# Patient Record
Sex: Female | Born: 1975 | Hispanic: Yes | Marital: Single | Smoking: Never smoker
Health system: Southern US, Community
[De-identification: ages and names within clinical notes are randomized; demographics above are authoritative.]

---

## 2018-03-31 ENCOUNTER — Other Ambulatory Visit: Payer: Self-pay | Admitting: Obstetrics and Gynecology

## 2018-03-31 DIAGNOSIS — Z1231 Encounter for screening mammogram for malignant neoplasm of breast: Secondary | ICD-10-CM

## 2018-06-10 ENCOUNTER — Ambulatory Visit (HOSPITAL_COMMUNITY): Payer: Self-pay

## 2018-07-16 ENCOUNTER — Other Ambulatory Visit (HOSPITAL_COMMUNITY): Payer: Self-pay | Admitting: *Deleted

## 2018-07-16 DIAGNOSIS — Z01419 Encounter for gynecological examination (general) (routine) without abnormal findings: Secondary | ICD-10-CM

## 2018-07-18 ENCOUNTER — Inpatient Hospital Stay: Payer: Self-pay

## 2018-07-18 ENCOUNTER — Inpatient Hospital Stay: Payer: Self-pay | Attending: Obstetrics and Gynecology

## 2018-07-18 DIAGNOSIS — Z01419 Encounter for gynecological examination (general) (routine) without abnormal findings: Secondary | ICD-10-CM

## 2018-07-18 LAB — HEMOGLOBIN A1C
HEMOGLOBIN A1C: 5.7 % — AB (ref 4.8–5.6)
Mean Plasma Glucose: 116.89 mg/dL

## 2018-07-18 LAB — LIPID PANEL
Cholesterol: 182 mg/dL (ref 0–200)
HDL: 63 mg/dL (ref >40)
LDL CALC: 106 mg/dL — AB (ref 0–99)
TRIGLYCERIDES: 63 mg/dL (ref <150)
Total CHOL/HDL Ratio: 2.9 RATIO
VLDL: 13 mg/dL (ref 0–40)

## 2018-07-18 NOTE — Addendum Note (Signed)
Addended by: Deforest Hoyles D on: 07/18/2018 08:56 AM   Modules accepted: Orders

## 2018-07-18 NOTE — Progress Notes (Unsigned)
[facility] initial screening  Spanish interpreter- Ana Alverez-Pevida  Clinical Measurement:  Height: 60in Weight:  123lb Blood Pressure:  100/70 Blood Pressure #2: 100/58  Fasting Labs Drawn Today, will review with patient when they result.  Medical History:  Patient states that she has not been diagnosed with high cholesterol, high blood pressure, diabetes or heart disease.  Medications:  Patients states she is not taking any medications for high cholesterol, high blood pressure or diabetes.  She is not taking aspirin daily to prevent heart attack or stroke.    Blood pressure, self measurement:  Patients states she does not measure blood pressure at home.    Nutrition:  Patient states she eats 2 cups of fruit and 1 cup of vegetables in an average day.  Patient states she does not eat fish regularly, she eats more than half a serving of whole grains daily. She drinks less than 36 ounces of beverages with added sugar weekly.  She is not  watching her sodium intake.  She has had 1 drink containing alcohol in the last seven days.  Physical activity:  Patient states that she gets 120 minutes of moderate exercise in a week.  She gets 90 minutes of vigorous exercise per week.      Smoking status:  Patient states she has never smoked and is not around any smokers.    Quality of life:  Patient states that she has had 0 bad physical days out of the last 30 days. In the last 2 weeks, she has had 0 days that she has felt down or depressed. She has had 0 days in the last 2 weeks that she has had little interest or pleasure in doing things.  Risk reduction and counseling:  Patient states she wants to  increase fruit and vegetable intake.  I encouraged her to continue with current exercise regimen and increase vegetable and fruit intake.  Navigation:  I will notify patient of lab results.  Patient is aware of 2 more health coaching sessions and a follow up.

## 2018-07-21 ENCOUNTER — Telehealth (HOSPITAL_COMMUNITY): Payer: Self-pay | Admitting: *Deleted

## 2018-07-21 NOTE — Telephone Encounter (Signed)
Health coaching 2  Spanish interpreter- Amber Long  Labs- LDL cholesterol 106, cholesterol 182, triglycerides 63, HDL cholesterol 63, hemoglobin A!C 5.7, mean plasma glucose 116.89  Patient is aware and understands these lab results.  Goals- Patient states that she is plans to increase her exercise.  I encouraged patient to increase her exercise regimen to 150 minutes weekly.  Patient states that she will increase her consumption of fruits and vegetables.  I encouraged patient to eat at least 5 fruits and vegetables daily.  Navigation:  Patient is aware of 1 more health coaching sessions and a follow up.  Time-10 minutes

## 2018-08-22 ENCOUNTER — Telehealth: Payer: Self-pay | Admitting: *Deleted

## 2018-08-22 NOTE — Telephone Encounter (Signed)
Via interpeter, Erika McReynolds, telephoned patient left a message regarding [facility]  health coaching.  

## 2018-09-16 ENCOUNTER — Other Ambulatory Visit: Payer: Self-pay

## 2018-09-16 ENCOUNTER — Ambulatory Visit
Admission: RE | Admit: 2018-09-16 | Discharge: 2018-09-16 | Disposition: A | Discharge status: Home / Self Care | Payer: Self-pay | Source: Ambulatory Visit | Attending: Obstetrics and Gynecology | Admitting: Obstetrics and Gynecology

## 2018-09-16 ENCOUNTER — Encounter (HOSPITAL_COMMUNITY): Payer: Self-pay

## 2018-09-16 ENCOUNTER — Ambulatory Visit (HOSPITAL_COMMUNITY)
Admission: RE | Admit: 2018-09-16 | Discharge: 2018-09-16 | Disposition: A | Discharge status: Home / Self Care | Payer: Self-pay | Source: Ambulatory Visit | Attending: Obstetrics and Gynecology | Admitting: Obstetrics and Gynecology

## 2018-09-16 VITALS — BP 102/64 | Wt 125.0 lb

## 2018-09-16 DIAGNOSIS — Z1239 Encounter for other screening for malignant neoplasm of breast: Secondary | ICD-10-CM

## 2018-09-16 DIAGNOSIS — Z1231 Encounter for screening mammogram for malignant neoplasm of breast: Secondary | ICD-10-CM

## 2018-09-16 NOTE — Patient Instructions (Signed)
Explained breast self awareness with Trishelle Laux. Patient did not need a Pap smear today due to last Pap smear was in August 2019 per patient. Let her know [facility] will cover Pap smears every 3 years unless has a history of abnormal Pap smears. Referred patient to the [facility] for a screening mammogram. Appointment scheduled for Tuesday, September 16, 2018 at 1410. Patient aware of appointment and will be there. Let patient know the Breast Center will follow up with her within the next couple weeks with results of mammogram by letter or phone. Kyndra Gramajo verbalized understanding.  Matis Monnier, Kathaleen Maser, RN 1:22 PM

## 2018-09-16 NOTE — Progress Notes (Signed)
No complaints today.   Pap Smear: Pap smear not completed today. Last Pap smear was in August 2019 at [facility] and normal per patient. Per patient has a history of an abnormal Pap smear 20 years ago that a colposcopy was completed for follow-up. Patient stated all Pap smears have been normal since colposcopy and has had more than three Pap smears. No Pap smear results are in Epic.  Physical exam: Breasts Breasts symmetrical. No skin abnormalities bilateral breasts. No nipple retraction bilateral breasts. No nipple discharge bilateral breasts. No lymphadenopathy. No lumps palpated bilateral breasts. No complaints of pain or tenderness on exam. Referred patient to the [facility] for a screening mammogram. Appointment scheduled for Tuesday, September 16, 2018 at 1410.        Pelvic/Bimanual No Pap smear completed today since last Pap smear was in August 2019 per patient. Pap smear not indicated per [facility] guidelines.   Smoking History: Patient has never smoked.  Patient Navigation: Patient education provided. Access to services provided for patient through [facility] program. Spanish interpreter provided.   Breast and Cervical Cancer Risk Assessment: Patient has no family history of breast cancer, known genetic mutations, or radiation treatment to the chest before age 52. Per patient has a history of cervical dysplasia. Patient has no history of being immunocompromised or DES exposure in-utero.  Risk Assessment    Risk Scores      09/16/2018   Last edited by: Lynnell Dike, LPN   5-year risk: 0.4 %   Lifetime risk: 5 %         Used Spanish interpreter Natale Lay from [employer].

## 2018-10-08 ENCOUNTER — Encounter (HOSPITAL_COMMUNITY): Payer: Self-pay | Admitting: *Deleted

## 2018-11-25 ENCOUNTER — Telehealth: Payer: Self-pay

## 2018-11-25 NOTE — Telephone Encounter (Signed)
Health Coaching 3  Spanish interpreter- Natale Lay   Goals- Patient states that she has been working out for at least 1 hour a day Monday-Saturday. Patient states that she has been doing online cardio workout videos Monday- Friday and that she has been running outside for 1 hour on Saturdays. Patient states that she has been consuming 2 cups of fruits daily and maybe 1 serving of vegetables daily. I encouraged patient to keep up the great work with her exercise regimen and with her fruit intake.   New goal-  Patient states that she wants her new goal to be to increase the amount of vegetables she eatings daily. I encouraged patient to try and aim for 2 1/2 cups of vegetables daily.  Barrier to reaching goal- Patient states that she doesn't have a lot of time every day to cut/chop and prepare vegetables to eat.     Strategies to overcome- I encouraged patient to look for fast and convenient options at the grocery store. I told her to look for already chopped vegetables in the produce section. I discussed with her how she could look for spaghetti sauces that had vegetables already added in them and she could look for frozen vegetable steamer bags that she could quickly microwave. I told her to also look for juices like V8 that contain vegetables and are also low in sugar as well as already prepared smoothies with vegetables included in the refrigerated produce section.   Navigation:  Patient is scheduled for final in person follow-up visit on July 8th @ 2:00 pm.   Time-

## 2019-01-06 ENCOUNTER — Telehealth: Payer: Self-pay

## 2019-01-06 NOTE — Telephone Encounter (Signed)
Called patient to do COVID screening for [facility] appointment. Voicemail box was full.

## 2019-01-07 ENCOUNTER — Ambulatory Visit: Payer: PRIVATE HEALTH INSURANCE

## 2020-05-07 IMAGING — MG DIGITAL SCREENING BILATERAL MAMMOGRAM WITH TOMO AND CAD
8 series · 9 of 24 positions shown · non-contrast
Comparison: None.

CLINICAL DATA: Screening.

EXAM:
DIGITAL SCREENING BILATERAL MAMMOGRAM WITH TOMO AND CAD

[R MLO synth-2D]
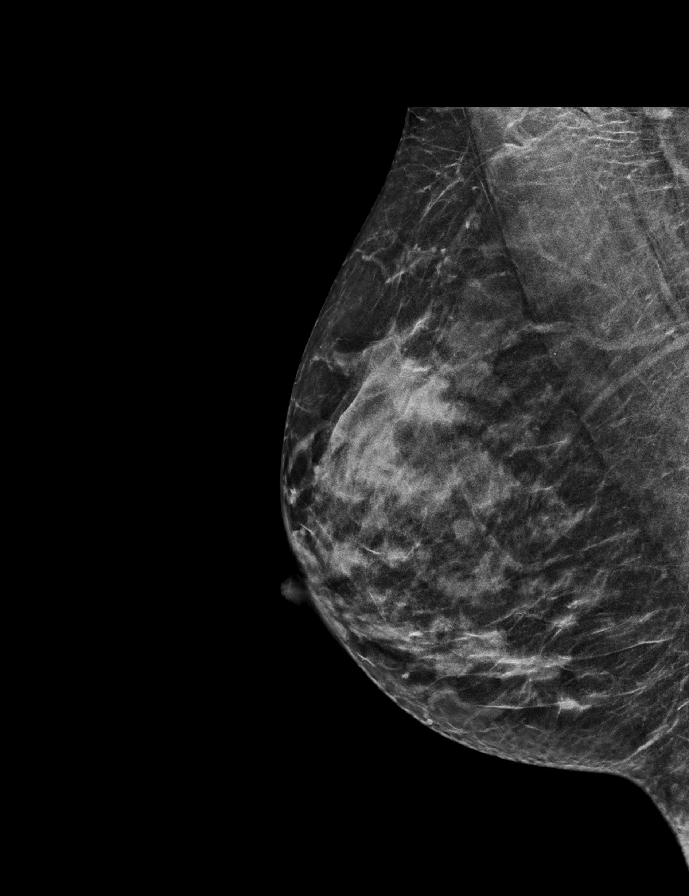

[L CC synth-2D]
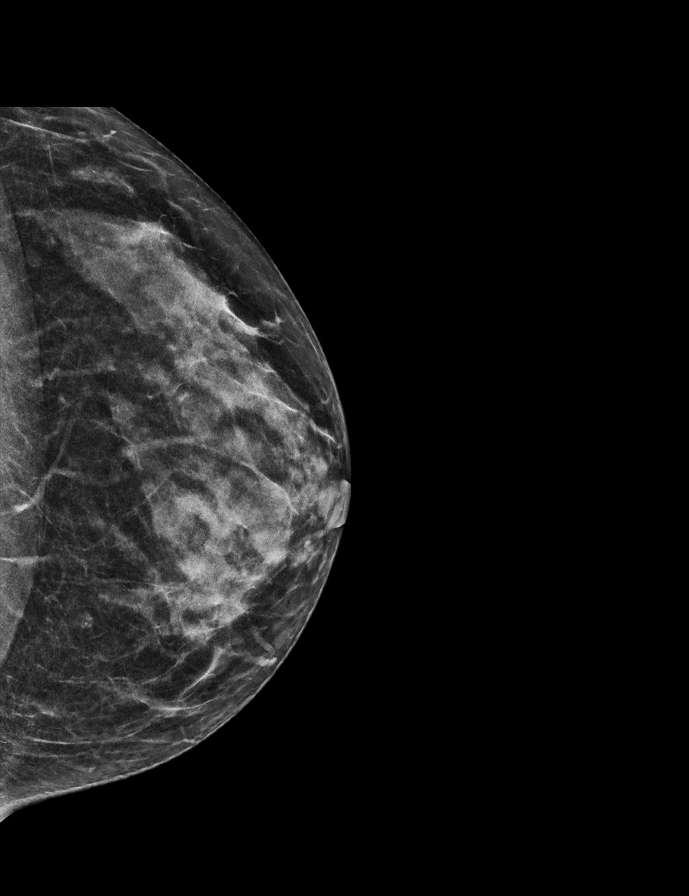

[R CC synth-2D]
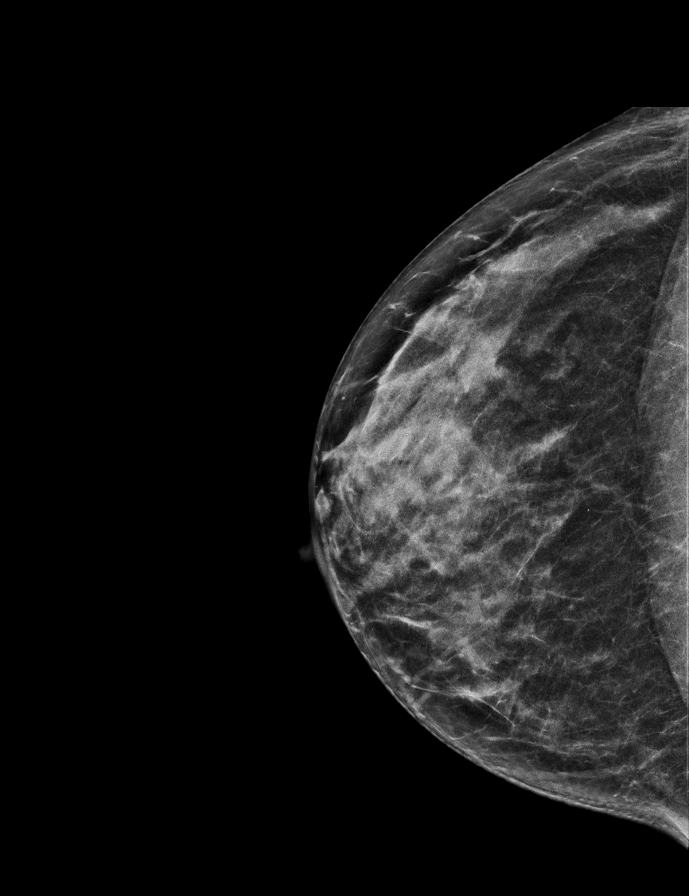

[L MLO synth-2D]
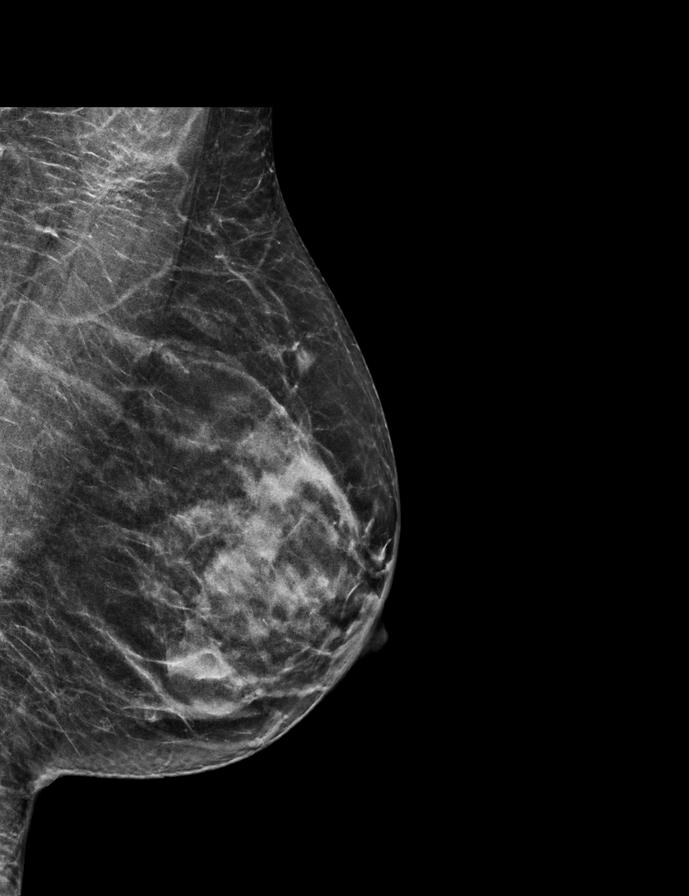

[R MLO tomo · 2 of 51 frames shown]
[frame 17/51]
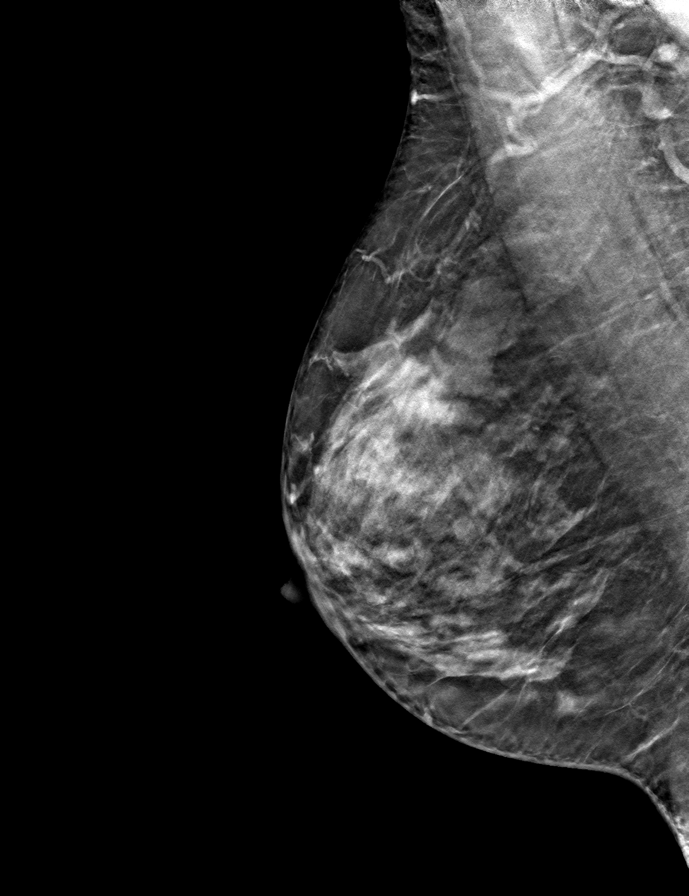
[frame 26/51]
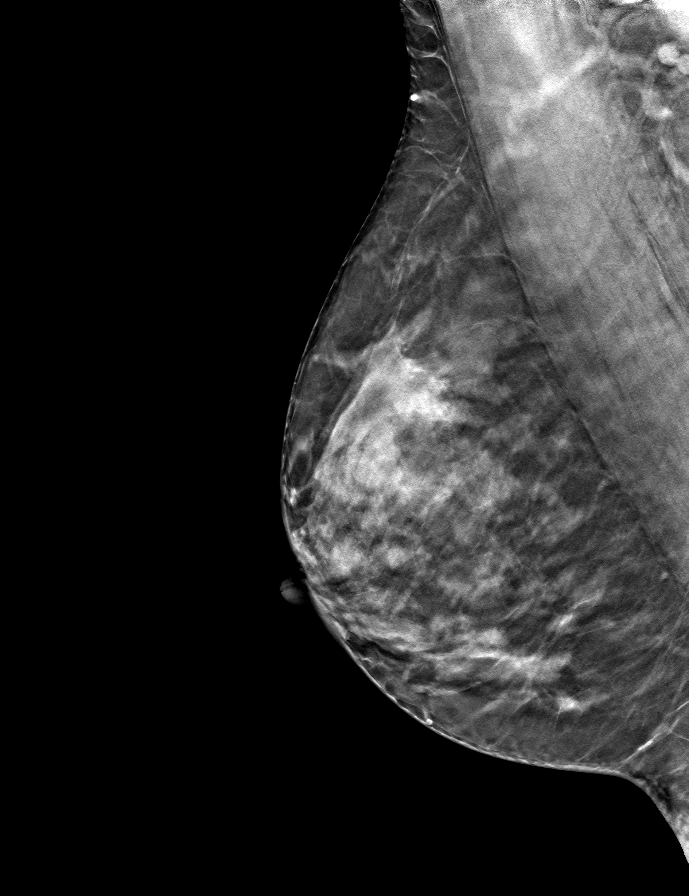

[R CC tomo · tomo slice 27/53.0]
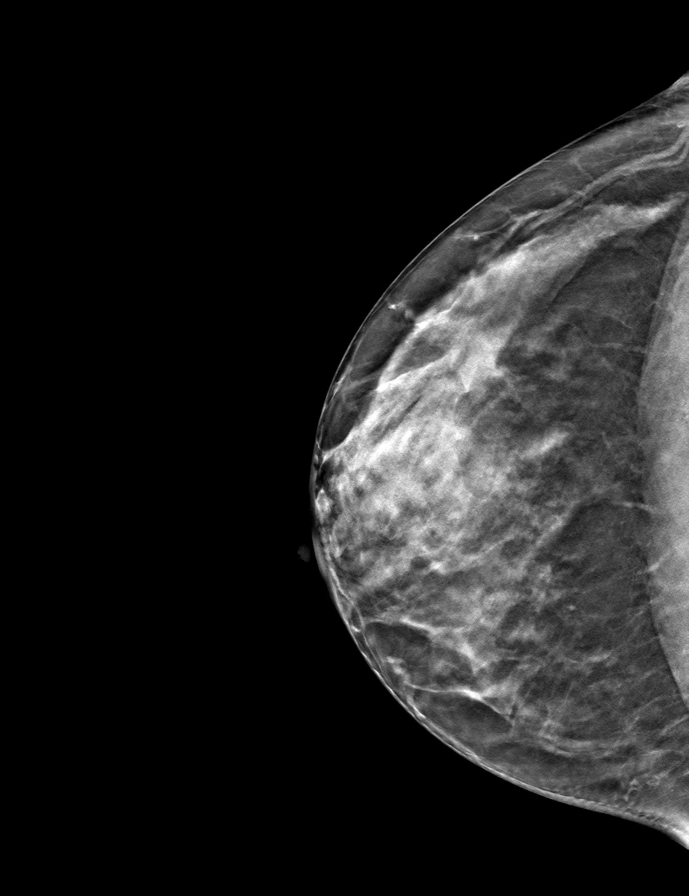

[L MLO tomo · tomo slice 28/55.0]
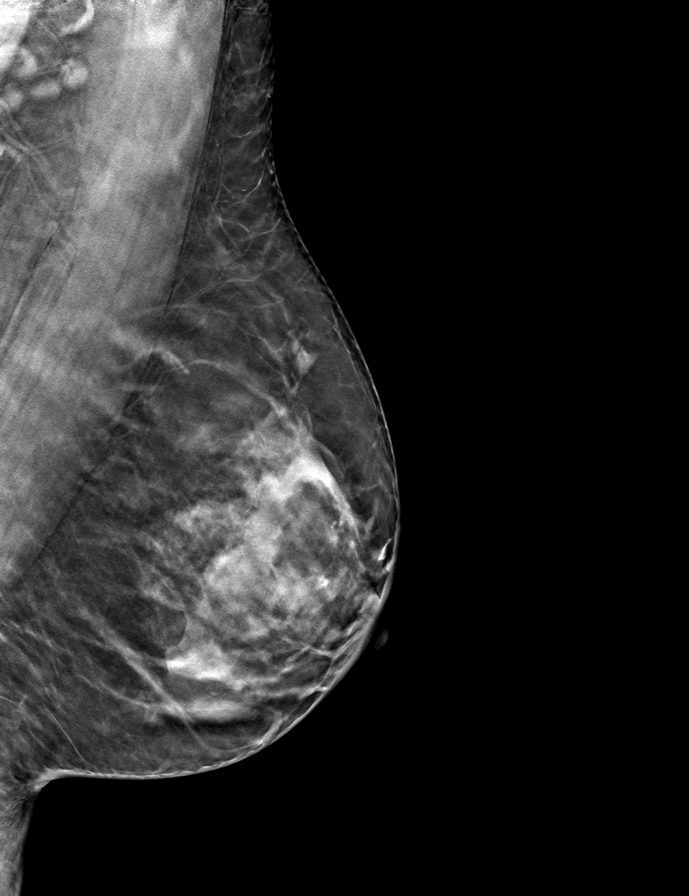

[L CC tomo · tomo slice 27/52.0]
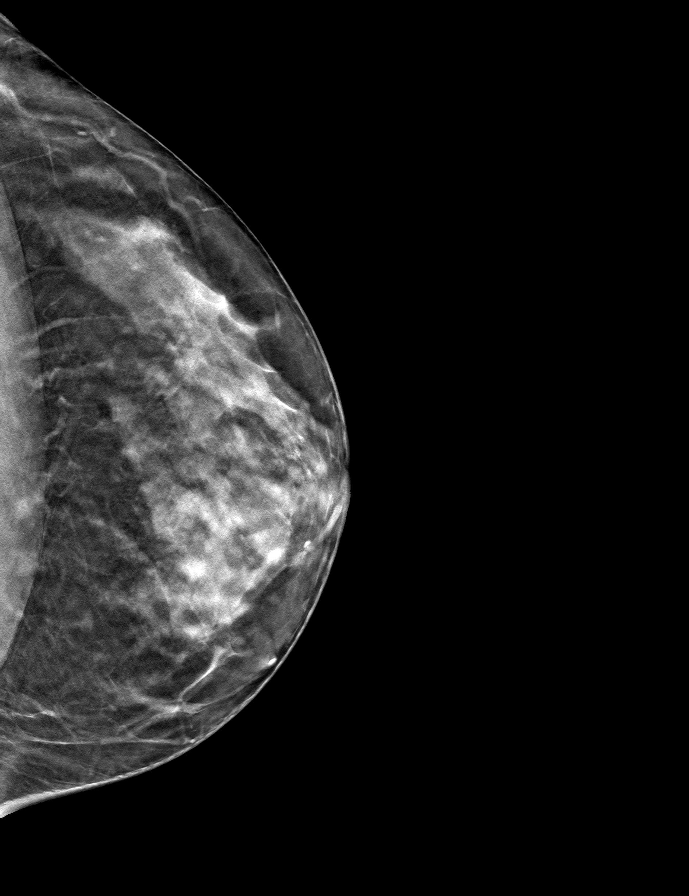

[9 of 24 positions shown; findings below may reference images not displayed]

ACR Breast Density Category c: The breast tissue is heterogeneously
dense, which may obscure small masses
FINDINGS: There are no findings suspicious for malignancy. Images were
processed with CAD.
IMPRESSION: No mammographic evidence of malignancy. A result letter of this
screening mammogram will be mailed directly to the patient.

RECOMMENDATION:
Screening mammogram in one year. (Code:EM-[id number])

BI-RADS CATEGORY  1: Negative.
